# Patient Record
Sex: Male | Born: 1990 | Race: White | Hispanic: No | Marital: Married | Smoking: Never smoker
Health system: Southern US, Community
[De-identification: ages and names within clinical notes are randomized; demographics above are authoritative.]

## PROBLEM LIST (undated history)

## (undated) DIAGNOSIS — K219 Gastro-esophageal reflux disease without esophagitis: Secondary | ICD-10-CM

## (undated) HISTORY — DX: Gastro-esophageal reflux disease without esophagitis: K21.9

---
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
## 2008-06-20 HISTORY — PX: KNEE ARTHROSCOPY W/ MENISCECTOMY: SHX1879

## 2009-04-06 ENCOUNTER — Ambulatory Visit (HOSPITAL_BASED_OUTPATIENT_CLINIC_OR_DEPARTMENT_OTHER): Admission: RE | Admit: 2009-04-06 | Discharge: 2009-04-06 | Payer: Self-pay | Admitting: Orthopedic Surgery

## 2009-06-20 HISTORY — PX: OTHER SURGICAL HISTORY: SHX169

## 2010-07-24 ENCOUNTER — Inpatient Hospital Stay (INDEPENDENT_AMBULATORY_CARE_PROVIDER_SITE_OTHER)
Admission: RE | Admit: 2010-07-24 | Discharge: 2010-07-24 | Disposition: A | Discharge status: Home / Self Care | Payer: No Typology Code available for payment source | Source: Ambulatory Visit

## 2010-07-24 ENCOUNTER — Ambulatory Visit (HOSPITAL_COMMUNITY)
Admission: RE | Admit: 2010-07-24 | Discharge: 2010-07-24 | Disposition: A | Discharge status: Home / Self Care | Payer: No Typology Code available for payment source | Source: Ambulatory Visit | Attending: Family Medicine | Admitting: Family Medicine

## 2010-07-24 DIAGNOSIS — M549 Dorsalgia, unspecified: Secondary | ICD-10-CM | POA: Insufficient documentation

## 2010-07-24 DIAGNOSIS — M542 Cervicalgia: Secondary | ICD-10-CM

## 2016-06-22 ENCOUNTER — Encounter (HOSPITAL_COMMUNITY): Payer: Self-pay | Admitting: *Deleted

## 2016-06-22 ENCOUNTER — Emergency Department (HOSPITAL_COMMUNITY): Payer: BLUE CROSS/BLUE SHIELD

## 2016-06-22 ENCOUNTER — Emergency Department (HOSPITAL_COMMUNITY)
Admission: EM | Admit: 2016-06-22 | Discharge: 2016-06-22 | Disposition: A | Discharge status: Home / Self Care | Payer: BLUE CROSS/BLUE SHIELD | Attending: Emergency Medicine | Admitting: Emergency Medicine

## 2016-06-22 DIAGNOSIS — R109 Unspecified abdominal pain: Secondary | ICD-10-CM | POA: Insufficient documentation

## 2016-06-22 DIAGNOSIS — M545 Low back pain: Secondary | ICD-10-CM | POA: Diagnosis present

## 2016-06-22 DIAGNOSIS — N201 Calculus of ureter: Secondary | ICD-10-CM

## 2016-06-22 LAB — CBC WITH DIFFERENTIAL/PLATELET
BASOS ABS: 0 10*3/uL (ref 0.0–0.1)
BASOS PCT: 0 %
EOS ABS: 0 10*3/uL (ref 0.0–0.7)
EOS PCT: 0 %
HEMATOCRIT: 38.2 % — AB (ref 39.0–52.0)
Hemoglobin: 13.2 g/dL (ref 13.0–17.0)
Lymphocytes Relative: 10 %
Lymphs Abs: 1.1 10*3/uL (ref 0.7–4.0)
MCH: 28.9 pg (ref 26.0–34.0)
MCHC: 34.6 g/dL (ref 30.0–36.0)
MCV: 83.6 fL (ref 78.0–100.0)
MONO ABS: 0.7 10*3/uL (ref 0.1–1.0)
MONOS PCT: 6 %
Neutro Abs: 9.5 10*3/uL — ABNORMAL HIGH (ref 1.7–7.7)
Neutrophils Relative %: 84 %
PLATELETS: 207 10*3/uL (ref 150–400)
RBC: 4.57 MIL/uL (ref 4.22–5.81)
RDW: 12.5 % (ref 11.5–15.5)
WBC: 11.4 10*3/uL — ABNORMAL HIGH (ref 4.0–10.5)

## 2016-06-22 LAB — URINALYSIS, ROUTINE W REFLEX MICROSCOPIC
BACTERIA UA: NONE SEEN
BILIRUBIN URINE: NEGATIVE
GLUCOSE, UA: NEGATIVE mg/dL
KETONES UR: 5 mg/dL — AB
Leukocytes, UA: NEGATIVE
NITRITE: NEGATIVE
PH: 5 (ref 5.0–8.0)
PROTEIN: NEGATIVE mg/dL
Specific Gravity, Urine: 1.014 (ref 1.005–1.030)
Squamous Epithelial / LPF: NONE SEEN

## 2016-06-22 LAB — BASIC METABOLIC PANEL
Anion gap: 8 (ref 5–15)
BUN: 13 mg/dL (ref 6–20)
CALCIUM: 9.4 mg/dL (ref 8.9–10.3)
CO2: 26 mmol/L (ref 22–32)
CREATININE: 0.93 mg/dL (ref 0.61–1.24)
Chloride: 106 mmol/L (ref 101–111)
Glucose, Bld: 140 mg/dL — ABNORMAL HIGH (ref 65–99)
Potassium: 3.9 mmol/L (ref 3.5–5.1)
SODIUM: 140 mmol/L (ref 135–145)

## 2016-06-22 MED ORDER — HYDROMORPHONE HCL 1 MG/ML IJ SOLN
1.0000 mg | Freq: Once | INTRAMUSCULAR | Status: AC
  Administered 2016-06-22: 1 mg via INTRAVENOUS
  Filled 2016-06-22: qty 1

## 2016-06-22 MED ORDER — SODIUM CHLORIDE 0.9 % IV BOLUS (SEPSIS)
1000.0000 mL | Freq: Once | INTRAVENOUS | Status: AC
  Administered 2016-06-22: 1000 mL via INTRAVENOUS

## 2016-06-22 MED ORDER — ONDANSETRON HCL 4 MG/2ML IJ SOLN
4.0000 mg | Freq: Once | INTRAMUSCULAR | Status: AC
  Administered 2016-06-22: 4 mg via INTRAVENOUS
  Filled 2016-06-22: qty 2

## 2016-06-22 MED ORDER — HYDROCODONE-ACETAMINOPHEN 5-325 MG PO TABS: 1.0000 | ORAL_TABLET | ORAL | 0 refills | Status: DC | PRN

## 2016-06-22 MED ORDER — KETOROLAC TROMETHAMINE 30 MG/ML IJ SOLN
30.0000 mg | Freq: Once | INTRAMUSCULAR | Status: AC
  Administered 2016-06-22: 30 mg via INTRAVENOUS
  Filled 2016-06-22: qty 1

## 2016-06-22 NOTE — ED Triage Notes (Signed)
Patient is alert and oriented x4.  He is complaining of lower back pain that started 20 minutes prior to arrival.  Currently he rates his pain 10 of 10.  No trauma noted prior to pain.

## 2016-06-22 NOTE — ED Provider Notes (Signed)
[facility]-EMERGENCY DEPT Provider Note   CSN: [id number] Arrival date & time: 06/22/16  0103    By signing my name below, I, Steven Webster, attest that this documentation has been prepared under the direction and in the presence of Pricilla LovelessScott Dvid Pendry, MD. Electronically Signed: Valentino SaxonBianca Webster, ED Scribe. 06/22/16. 3:20 AM.  History   Chief Complaint Chief Complaint  Patient presents with  . Back Pain   The history is provided by the patient and the spouse. No language interpreter was used.   HPI Comments: Steven HusbandsLuke J Webster is a 26 y.o. male with PMHx of kidney stone, who presents to the Emergency Department complaining of sudden onset, lower left back pain onset three hours ago. Pt denies recent trauma, injury, heavy lifting, falls, twisting, bending. Pt is ambulatory with minimal difficulty. Pt notes he was sleeping when he was suddenly awakened by his back pain. He reports associated nausea and episodic emesis. He notes having a couple of episodes of vomiting PTA and in the ED waiting room. Pt denies radiation of back pain. No alleviating factors noted. Denies bowel or bladder incontinence, fever, cough, abdominal pain, dysuria, hematuria, numbness and weakness.  History reviewed. No pertinent past medical history.  There are no active problems to display for this patient.   History reviewed. No pertinent surgical history.     Home Medications    Prior to Admission medications   Medication Sig Start Date End Date Taking? Authorizing Provider  HYDROcodone-acetaminophen (NORCO) 5-325 MG tablet Take 1-2 tablets by mouth every 4 (four) hours as needed for severe pain. 06/22/16   Pricilla LovelessScott Nocole Zammit, MD    Family History No family history on file.  Social History Social History  Substance Use Topics  . Smoking status: Never Smoker  . Smokeless tobacco: Never Used  . Alcohol use Yes     Allergies   Patient has no known allergies.   Review of Systems Review of Systems    Constitutional: Negative for fever.  Respiratory: Negative for cough.   Gastrointestinal: Positive for nausea and vomiting. Negative for abdominal pain.  Genitourinary: Negative for dysuria and hematuria.  Musculoskeletal: Positive for back pain.  Neurological: Negative for weakness and numbness.  All other systems reviewed and are negative.    Physical Exam Updated Vital Signs BP 116/69 (BP Location: Left Arm)   Pulse 67   Temp 97.9 F (36.6 C) (Oral)   Resp 15   Ht 5\' 9"  (1.753 m)   Wt 165 lb (74.8 kg)   SpO2 99%   BMI 24.37 kg/m   Physical Exam  Constitutional: He is oriented to person, place, and time. He appears well-developed and well-nourished.  Laying on his side, appears in significant pain  HENT:  Head: Normocephalic and atraumatic.  Right Ear: External ear normal.  Left Ear: External ear normal.  Nose: Nose normal.  Eyes: Right eye exhibits no discharge. Left eye exhibits no discharge.  Neck: Neck supple.  Cardiovascular: Normal rate, regular rhythm and normal heart sounds.   Pulmonary/Chest: Effort normal and breath sounds normal.  Abdominal: Soft. There is no tenderness.  Musculoskeletal: He exhibits tenderness. He exhibits no edema.  Left CVA tenderness.   Neurological: He is alert and oriented to person, place, and time.  Skin: Skin is warm and dry.  Nursing note and vitals reviewed.    ED Treatments / Results   DIAGNOSTIC STUDIES: Oxygen Saturation is 97% on RA, normal by my interpretation.    COORDINATION OF CARE: 3:20 AM Discussed treatment plan  with pt at bedside which includes labs, nausea medication, pain medication and nonsteroidal anti-Inflammatory drug and pt agreed to plan.   Labs (all labs ordered are listed, but only abnormal results are displayed) Labs Reviewed  URINALYSIS, ROUTINE W REFLEX MICROSCOPIC - Abnormal; Notable for the following:       Result Value   Hgb urine dipstick SMALL (*)    Ketones, ur 5 (*)    All other  components within normal limits  CBC WITH DIFFERENTIAL/PLATELET - Abnormal; Notable for the following:    WBC 11.4 (*)    HCT 38.2 (*)    Neutro Abs 9.5 (*)    All other components within normal limits  BASIC METABOLIC PANEL - Abnormal; Notable for the following:    Glucose, Bld 140 (*)    All other components within normal limits    EKG  EKG Interpretation None       Radiology Ct Renal Stone Study  Result Date: 06/22/2016 CLINICAL DATA:  Left flank pain and microhematuria. Pain began this morning. EXAM: CT ABDOMEN AND PELVIS WITHOUT CONTRAST TECHNIQUE: Multidetector CT imaging of the abdomen and pelvis was performed following the standard protocol without IV contrast. COMPARISON:  06/18/2006 FINDINGS: Lower chest: No acute abnormality. Hepatobiliary: No focal liver abnormality is seen. No gallstones, gallbladder wall thickening, or biliary dilatation. Pancreas: Unremarkable. No pancreatic ductal dilatation or surrounding inflammatory changes. Spleen: Normal in size without focal abnormality. Adrenals/Urinary Tract: Both adrenals are normal. There is hydronephrosis and hydroureter on the left, all the way to the ureterovesical junction. There is moderate periureteral and perinephric stranding indicating an acute process. No ureteral calculus is evident, but there is a 3 x 5 mm calculus in the urinary bladder lumen. This probably is a recently passed stone. Right kidney and right ureter are normal. No additional urinary tract calculi are evident. No additional acute findings are evident. Stomach/Bowel: Stomach is within normal limits. Colon appears normal. No evidence of bowel wall thickening, distention, or inflammatory changes. Vascular/Lymphatic: No significant vascular findings are present. No enlarged abdominal or pelvic lymph nodes. Reproductive: Unremarkable Other: No ascites. Musculoskeletal: No significant skeletal lesions. IMPRESSION: 3 x 5 mm calculus within the urinary bladder lumen.  Probable recent stone passage on the left, with moderate hydroureteronephrosis and periureteral stranding. Electronically Signed   By: Ellery Plunk M.D.   On: 06/22/2016 04:47    Procedures Procedures (including critical care time)  Medications Ordered in ED Medications  sodium chloride 0.9 % bolus 1,000 mL (0 mLs Intravenous Stopped 06/22/16 0427)  HYDROmorphone (DILAUDID) injection 1 mg (1 mg Intravenous Given 06/22/16 0326)  ketorolac (TORADOL) 30 MG/ML injection 30 mg (30 mg Intravenous Given 06/22/16 0326)  ondansetron (ZOFRAN) injection 4 mg (4 mg Intravenous Given 06/22/16 0326)     Initial Impression / Assessment and Plan / ED Course  I have reviewed the triage vital signs and the nursing notes.  Pertinent labs & imaging results that were available during my care of the patient were reviewed by me and considered in my medical decision making (see chart for details).  Clinical Course as of Jun 23 715  Wed Jun 22, 2016  4098 Likely a ureteral stone. IV fluids, dilaudid, toradol, zofran and CT  [SG]  0507 Patient is feeling better. CT shows stone now in the bladder. Likely it was at the [facility] when I saw him and he passed it after pain medicine.  [SG]    Clinical Course User Index [SG] Pricilla Loveless, MD  Patient's symptoms c/w ureteral stone. Now in bladder. NSAIDs, short course of pain for residual spasm and passing through urethra. No infectious symptoms. Appears to be second episode overall, will refer to urology  Final Clinical Impressions(s) / ED Diagnoses   Final diagnoses:  Left ureteral stone    New Prescriptions Discharge Medication List as of 06/22/2016  5:07 AM    START taking these medications   Details  HYDROcodone-acetaminophen (NORCO) 5-325 MG tablet Take 1-2 tablets by mouth every 4 (four) hours as needed for severe pain., Starting Wed 06/22/2016, Print        I personally performed the services described in this documentation, which was scribed in my  presence. The recorded information has been reviewed and is accurate.     Pricilla Loveless, MD 06/22/16 [phone]

## 2017-01-12 ENCOUNTER — Encounter (HOSPITAL_COMMUNITY): Payer: Self-pay | Admitting: Emergency Medicine

## 2017-01-12 ENCOUNTER — Emergency Department (HOSPITAL_COMMUNITY): Payer: BLUE CROSS/BLUE SHIELD

## 2017-01-12 DIAGNOSIS — R0789 Other chest pain: Secondary | ICD-10-CM | POA: Diagnosis not present

## 2017-01-12 DIAGNOSIS — R1033 Periumbilical pain: Secondary | ICD-10-CM | POA: Diagnosis not present

## 2017-01-12 DIAGNOSIS — A084 Viral intestinal infection, unspecified: Secondary | ICD-10-CM | POA: Insufficient documentation

## 2017-01-12 LAB — BASIC METABOLIC PANEL
ANION GAP: 10 (ref 5–15)
BUN: 12 mg/dL (ref 6–20)
CALCIUM: 9.3 mg/dL (ref 8.9–10.3)
CO2: 24 mmol/L (ref 22–32)
Chloride: 103 mmol/L (ref 101–111)
Creatinine, Ser: 1.06 mg/dL (ref 0.61–1.24)
Glucose, Bld: 107 mg/dL — ABNORMAL HIGH (ref 65–99)
Potassium: 4 mmol/L (ref 3.5–5.1)
Sodium: 137 mmol/L (ref 135–145)

## 2017-01-12 LAB — CBC
HCT: 41.8 % (ref 39.0–52.0)
HEMOGLOBIN: 14.7 g/dL (ref 13.0–17.0)
MCH: 29.3 pg (ref 26.0–34.0)
MCHC: 35.2 g/dL (ref 30.0–36.0)
MCV: 83.4 fL (ref 78.0–100.0)
Platelets: 198 10*3/uL (ref 150–400)
RBC: 5.01 MIL/uL (ref 4.22–5.81)
RDW: 12.5 % (ref 11.5–15.5)
WBC: 7.4 10*3/uL (ref 4.0–10.5)

## 2017-01-12 LAB — I-STAT TROPONIN, ED: TROPONIN I, POC: 0 ng/mL (ref 0.00–0.08)

## 2017-01-12 NOTE — ED Triage Notes (Signed)
Pt presents with midsternal CP that radiates to L jaw line x couple days worse last night; pt denies hx of same

## 2017-01-13 ENCOUNTER — Emergency Department (HOSPITAL_COMMUNITY): Payer: BLUE CROSS/BLUE SHIELD

## 2017-01-13 ENCOUNTER — Emergency Department (HOSPITAL_COMMUNITY)
Admission: EM | Admit: 2017-01-13 | Discharge: 2017-01-13 | Disposition: A | Discharge status: Home / Self Care | Payer: BLUE CROSS/BLUE SHIELD | Attending: Emergency Medicine | Admitting: Emergency Medicine

## 2017-01-13 ENCOUNTER — Encounter (HOSPITAL_COMMUNITY): Payer: Self-pay

## 2017-01-13 DIAGNOSIS — A084 Viral intestinal infection, unspecified: Secondary | ICD-10-CM

## 2017-01-13 DIAGNOSIS — R110 Nausea: Secondary | ICD-10-CM

## 2017-01-13 DIAGNOSIS — R1033 Periumbilical pain: Secondary | ICD-10-CM

## 2017-01-13 DIAGNOSIS — R509 Fever, unspecified: Secondary | ICD-10-CM

## 2017-01-13 DIAGNOSIS — R0789 Other chest pain: Secondary | ICD-10-CM

## 2017-01-13 LAB — URINALYSIS, ROUTINE W REFLEX MICROSCOPIC
Bilirubin Urine: NEGATIVE
GLUCOSE, UA: NEGATIVE mg/dL
Hgb urine dipstick: NEGATIVE
Ketones, ur: 20 mg/dL — AB
LEUKOCYTES UA: NEGATIVE
Nitrite: NEGATIVE
PROTEIN: NEGATIVE mg/dL
Specific Gravity, Urine: 1.01 (ref 1.005–1.030)
pH: 5 (ref 5.0–8.0)

## 2017-01-13 LAB — HEPATIC FUNCTION PANEL
ALK PHOS: 52 U/L (ref 38–126)
ALT: 16 U/L — AB (ref 17–63)
AST: 26 U/L (ref 15–41)
Albumin: 4.5 g/dL (ref 3.5–5.0)
BILIRUBIN INDIRECT: 0.7 mg/dL (ref 0.3–0.9)
Bilirubin, Direct: 0.2 mg/dL (ref 0.1–0.5)
TOTAL PROTEIN: 7.3 g/dL (ref 6.5–8.1)
Total Bilirubin: 0.9 mg/dL (ref 0.3–1.2)

## 2017-01-13 LAB — I-STAT CG4 LACTIC ACID, ED: LACTIC ACID, VENOUS: 0.74 mmol/L (ref 0.5–1.9)

## 2017-01-13 LAB — I-STAT TROPONIN, ED: Troponin i, poc: 0.01 ng/mL (ref 0.00–0.08)

## 2017-01-13 LAB — LIPASE, BLOOD: LIPASE: 54 U/L — AB (ref 11–51)

## 2017-01-13 MED ORDER — ONDANSETRON HCL 4 MG/2ML IJ SOLN
4.0000 mg | Freq: Once | INTRAMUSCULAR | Status: AC
  Administered 2017-01-13: 4 mg via INTRAVENOUS
  Filled 2017-01-13: qty 2

## 2017-01-13 MED ORDER — SODIUM CHLORIDE 0.9 % IV BOLUS (SEPSIS)
1000.0000 mL | Freq: Once | INTRAVENOUS | Status: AC
  Administered 2017-01-13: 1000 mL via INTRAVENOUS

## 2017-01-13 MED ORDER — ONDANSETRON 4 MG PO TBDP: 4.0000 mg | ORAL_TABLET | Freq: Three times a day (TID) | ORAL | 0 refills | Status: DC | PRN

## 2017-01-13 MED ORDER — IOPAMIDOL (ISOVUE-300) INJECTION 61%
INTRAVENOUS | Status: AC
  Administered 2017-01-13: 80 mL
  Filled 2017-01-13: qty 100

## 2017-01-13 MED ORDER — GI COCKTAIL ~~LOC~~
30.0000 mL | Freq: Once | ORAL | Status: AC
  Administered 2017-01-13: 30 mL via ORAL
  Filled 2017-01-13: qty 30

## 2017-01-13 MED ORDER — FAMOTIDINE IN NACL 20-0.9 MG/50ML-% IV SOLN
20.0000 mg | Freq: Once | INTRAVENOUS | Status: AC
  Administered 2017-01-13: 20 mg via INTRAVENOUS
  Filled 2017-01-13: qty 50

## 2017-01-13 MED ORDER — MORPHINE SULFATE (PF) 4 MG/ML IV SOLN
4.0000 mg | Freq: Once | INTRAVENOUS | Status: AC
  Administered 2017-01-13: 4 mg via INTRAVENOUS
  Filled 2017-01-13: qty 1

## 2017-01-13 MED ORDER — RANITIDINE HCL 150 MG PO TABS: 150.0000 mg | ORAL_TABLET | Freq: Two times a day (BID) | ORAL | 0 refills | Status: DC

## 2017-01-13 NOTE — ED Notes (Signed)
ED Provider at bedside. 

## 2017-01-13 NOTE — ED Notes (Signed)
Patient transported to CT 

## 2017-01-13 NOTE — ED Provider Notes (Signed)
[facility]-EMERGENCY DEPT Provider Note   CSN: [id number] Arrival date & time: 01/12/17  2037     History   Chief Complaint Chief Complaint  Patient presents with  . Chest Pain    HPI Steven Webster is a 26 y.o. male with a PMHx of kidney stones, who presents to the ED with complaints of periumbilical abdominal pain that began 4 days ago but worsened last night. Patient describes the pain as 6/10 intermittent sharp periumbilical pain that radiates to the middle of his back, with no known aggravating factors, and unrelieved with Zantac, Philip's stool softeners, and Advil. Associated symptoms include nausea, constipation with no BM in 3 days, and today he developed fever of 100.4 as well as chills and sweats. Last night he also developed dull central chest pain that is mild and radiated to his left jaw however this is not what brought him in today and is not as severe as his abdominal pain is. He has a history of kidney stones however he does not feel like today symptoms feel like that. He does not currently have a PCP since he just moved here one year ago. He has not eaten anything since 8 AM. He denies any recent travel, sick contacts, suspicious food intake, NSAID use, or prior abdominal surgeries. He is a nonsmoker. +FHx of MI in [place] in his 4740s. +EtOH use, 1 liquor drink ~3-4x/wk.   He denies URI symptoms, cough, SOB, wheezing, lightheadedness, diaphoresis, claudication, orthopnea, LE swelling, recent travel/surgery/immobilization, personal/family hx of DVT/PE, vomiting, diarrhea, obstipation, melena or hematochezia with his last BM, hematuria, dysuria, testicular pain/swelling, penile discharge, rectal pain, myalgias, arthralgias, numbness, tingling, focal weakness, or any other complaints at this time.    The history is provided by the patient and medical records. No language interpreter was used.  Abdominal Pain   This is a new problem. The current episode started more than 2 days ago. Episode  frequency: intermittent. The problem has been gradually worsening. The pain is associated with an unknown factor. The pain is located in the periumbilical region. The quality of the pain is sharp. The pain is at a severity of 6/10. The pain is moderate. Associated symptoms include fever, nausea and constipation. Pertinent negatives include diarrhea, flatus, hematochezia, melena, vomiting, dysuria, hematuria, arthralgias and myalgias. Nothing aggravates the symptoms. Nothing relieves the symptoms.    History reviewed. No pertinent past medical history.  There are no active problems to display for this patient.   History reviewed. No pertinent surgical history.     Home Medications    Prior to Admission medications   Medication Sig Start Date End Date Taking? Authorizing Provider  HYDROcodone-acetaminophen (NORCO) 5-325 MG tablet Take 1-2 tablets by mouth every 4 (four) hours as needed for severe pain. 06/22/16   Pricilla LovelessGoldston, Scott, MD    Family History History reviewed. No pertinent family history.  Social History Social History  Substance Use Topics  . Smoking status: Never Smoker  . Smokeless tobacco: Never Used  . Alcohol use Yes     Allergies   Patient has no known allergies.   Review of Systems Review of Systems  Constitutional: Positive for chills and fever. Negative for diaphoresis.  HENT: Negative for rhinorrhea and sore throat.   Respiratory: Negative for cough, shortness of breath and wheezing.   Cardiovascular: Positive for chest pain (dull central). Negative for leg swelling.  Gastrointestinal: Positive for abdominal pain, constipation and nausea. Negative for blood in stool, diarrhea, flatus, hematochezia, melena, rectal pain  and vomiting.  Genitourinary: Negative for discharge, dysuria, hematuria, scrotal swelling and testicular pain.  Musculoskeletal: Negative for arthralgias and myalgias.  Skin: Negative for color change.  Allergic/Immunologic: Negative for  immunocompromised state.  Neurological: Negative for weakness, light-headedness and numbness.  Psychiatric/Behavioral: Negative for confusion.   All other systems reviewed and are negative for acute change except as noted in the HPI.    Physical Exam Updated Vital Signs BP 132/86   Pulse 92   Temp 99.6 F (37.6 C) (Oral)   Resp 18   Ht 5\' 10"  (1.778 m)   Wt 74.8 kg (165 lb)   SpO2 99%   BMI 23.68 kg/m   Physical Exam  Constitutional: He is oriented to person, place, and time. Vital signs are normal. He appears well-developed and well-nourished.  Non-toxic appearance. No distress.  Low-grade temp 99.6, nontoxic, NAD  HENT:  Head: Normocephalic and atraumatic.  Mouth/Throat: Oropharynx is clear and moist and mucous membranes are normal.  Eyes: Conjunctivae and EOM are normal. Right eye exhibits no discharge. Left eye exhibits no discharge.  Neck: Normal range of motion. Neck supple.  Cardiovascular: Normal rate, regular rhythm, normal heart sounds and intact distal pulses.  Exam reveals no gallop and no friction rub.   No murmur heard. RRR, nl s1/s2, no m/r/g, distal pulses intact, no pedal edema   Pulmonary/Chest: Effort normal and breath sounds normal. No respiratory distress. He has no decreased breath sounds. He has no wheezes. He has no rhonchi. He has no rales.  CTAB in all lung fields, no w/r/r, no hypoxia or increased WOB, speaking in full sentences, SpO2 96% on RA   Abdominal: Soft. Normal appearance and bowel sounds are normal. He exhibits no distension. There is tenderness in the right upper quadrant, epigastric area and periumbilical area. There is no rigidity, no rebound, no guarding, no CVA tenderness, no tenderness at McBurney's point and negative Murphy's sign.  Soft, nondistended, +BS throughout, with mild epigastric/RUQ/periumbilical TTP, no r/g/r, neg murphy's although elicits pain but still able to fully inspire; neg mcburney's point tenderness, no lower abd TTP, no  CVA TTP   Musculoskeletal: Normal range of motion.  MAE x4 Strength and sensation grossly intact in all extremities Distal pulses intact No pedal edema, neg homan's bilaterally   Neurological: He is alert and oriented to person, place, and time. He has normal strength. No sensory deficit.  Skin: Skin is warm, dry and intact. No rash noted.  Psychiatric: He has a normal mood and affect.  Nursing note and vitals reviewed.    ED Treatments / Results  Labs (all labs ordered are listed, but only abnormal results are displayed) Labs Reviewed  BASIC METABOLIC PANEL - Abnormal; Notable for the following:       Result Value   Glucose, Bld 107 (*)    All other components within normal limits  HEPATIC FUNCTION PANEL - Abnormal; Notable for the following:    ALT 16 (*)    All other components within normal limits  URINALYSIS, ROUTINE W REFLEX MICROSCOPIC - Abnormal; Notable for the following:    Ketones, ur 20 (*)    All other components within normal limits  LIPASE, BLOOD - Abnormal; Notable for the following:    Lipase 54 (*)    All other components within normal limits  CBC  I-STAT TROPONIN, ED  I-STAT TROPONIN, ED  I-STAT CG4 LACTIC ACID, ED    EKG  EKG Interpretation  Date/Time:  Thursday January 12 2017 20:50:45 EDT  Ventricular Rate:  91 PR Interval:  128 QRS Duration: 84 QT Interval:  326 QTC Calculation: 400 R Axis:   76 Text Interpretation:  Normal sinus rhythm Normal ECG No old tracing to compare Confirmed by Azalia Bilis ([id number]) on 01/13/2017 1:35:43 AM       Radiology Dg Chest 2 View  Result Date: 01/12/2017 CLINICAL DATA:  Central chest pain. EXAM: CHEST  2 VIEW COMPARISON:  None. FINDINGS: The heart size and mediastinal contours are within normal limits. Both lungs are clear. The visualized skeletal structures are unremarkable. IMPRESSION: No active cardiopulmonary disease. Electronically Signed   By: Sherian Rein M.D.   On: 01/12/2017 21:23   Ct Abdomen Pelvis  W Contrast  Result Date: 01/13/2017 CLINICAL DATA:  Periumbilical pain for a few days. Nausea and vomiting. Low-grade chills and subjective fever EXAM: CT ABDOMEN AND PELVIS WITH CONTRAST TECHNIQUE: Multidetector CT imaging of the abdomen and pelvis was performed using the standard protocol following bolus administration of intravenous contrast. CONTRAST:  80ml ISOVUE-300 IOPAMIDOL (ISOVUE-300) INJECTION 61% COMPARISON:  06/22/2016 FINDINGS: Lower chest: No acute abnormality. Hepatobiliary: No focal liver abnormality is seen. No gallstones, gallbladder wall thickening, or biliary dilatation. Pancreas: Unremarkable. No pancreatic ductal dilatation or surrounding inflammatory changes. Spleen: Normal in size without focal abnormality. Adrenals/Urinary Tract: Adrenal glands are unremarkable. Kidneys are normal, without renal calculi, focal lesion, or hydronephrosis. Bladder is unremarkable. Stomach/Bowel: Stomach is normal. Proximal small bowel is normal. Slight fluid distention of mid to distal small bowel with mild mural enhancement. No caliber transition to decompressed distal small bowel. Generous volume stool throughout colon. No inflammation or obstruction of colon. No focal inflammation of bowel. No extraluminal gas. Vascular/Lymphatic: No significant vascular findings are present. No enlarged abdominal or pelvic lymph nodes. Reproductive: Unremarkable Other: No ascites. Musculoskeletal: No significant skeletal lesion. IMPRESSION: Generous volume fluid throughout slightly distended mid to distal small bowel with mild mural enhancement. This is nonobstructive and may represent an enteritis. Electronically Signed   By: Ellery Plunk M.D.   On: 01/13/2017 02:40    Procedures Procedures (including critical care time)  Medications Ordered in ED Medications  ondansetron (ZOFRAN) injection 4 mg (4 mg Intravenous Given 01/13/17 0204)  sodium chloride 0.9 % bolus 1,000 mL (0 mLs Intravenous Stopped 01/13/17  0356)  morphine 4 MG/ML injection 4 mg (4 mg Intravenous Given 01/13/17 0204)  iopamidol (ISOVUE-300) 61 % injection (80 mLs  Contrast Given 01/13/17 0218)  gi cocktail (Maalox,Lidocaine,Donnatal) (30 mLs Oral Given 01/13/17 0304)  famotidine (PEPCID) IVPB 20 mg premix (0 mg Intravenous Stopped 01/13/17 0311)     Initial Impression / Assessment and Plan / ED Course  I have reviewed the triage vital signs and the nursing notes.  Pertinent labs & imaging results that were available during my care of the patient were reviewed by me and considered in my medical decision making (see chart for details).     26 y.o. male here with c/o periumbilical/upper abd pain radiating to his back x4 days that worsened last night, and then developed mild dull central CP last night but states that's not what brought him in. Reports fever 100.4 at home, chills, and nausea, and constipation. On exam, mild RUQ/epigastric TTP, no lower abd TTP, nonperitoneal, neg murphy's although murphy's exam elicits pain but he's able to fully inspire. Work up thus far: EKG unremarkable, trop neg x2, BMP WNL, CBC WNL, CXR neg. Unfortunately I think triage nursing staff misunderstood what his complaint was, so we'll need to add  on a few other items in order to further work up his abd pain complaint, which is really what he's here for. Doubt appendicitis, although fever is slightly worrisome for that, but lack of leukocytosis is reassuring. Will add on lactic, U/A, LFTs, lipase, and get CT abd/pelv (considered getting abd xray and abd U/S, but CT would give [place] a more complete view of everything, and still eval for all the same things we'd be looking for with the xray/US combo). Will give morphine, zofran, fluids, pepcid, GI cocktail, and reassess afterwards.   5:17 AM Lactic neg. U/A with a few ketones but otherwise negative. LFTs unremarkable. Lipase 54 which is just barely over the cusp of normal, could reflect downtrending level, but doubt  acute pancreatitis at this point. CT abd/pelv showing generous volume through slightly distended mid-to-distal small bowel which is nonobstructive and could represent enteritis. He has no diarrhea, but he could just be backed up (has some solid stool scattered around the colon on CT images) and the diarrhea could be coming. Overall, could be viral illness/gastroenteritis with gastritis/GERD causing the CP he was reporting. With the remainder of the work up being reassuring, doubt need for further emergent work up at this pt. Pt feeling better and tolerating PO well. Will send home with zantac and zofran rx, advised BRAT diet if he starts having diarrhea, stay hydrated, tylenol/motrin for pain, diet/lifestyle modifications advised, and f/up with a PCP using insurance carrier's website to help find one, or using the resource number on d/c paperwork. Strict return precautions advised. Discussed case with my attending Dr. Patria Mane who agrees with plan.  I explained the diagnosis and have given explicit precautions to return to the ER including for any other new or worsening symptoms. The patient understands and accepts the medical plan as it's been dictated and I have answered their questions. Discharge instructions concerning home care and prescriptions have been given. The patient is STABLE and is discharged to home in good condition.    Final Clinical Impressions(s) / ED Diagnoses   Final diagnoses:  Periumbilical abdominal pain  Nausea  Viral gastroenteritis  Fever chills  Atypical chest pain    New Prescriptions New Prescriptions   ONDANSETRON (ZOFRAN ODT) 4 MG DISINTEGRATING TABLET    Take 1 tablet (4 mg total) by mouth every 8 (eight) hours as needed for nausea or vomiting.   RANITIDINE (ZANTAC) 150 MG TABLET    Take 1 tablet (150 mg total) by mouth 2 (two) times daily.     [address] [place], [place] 01/13/17 Jorja Loa, MD 01/14/17 [phone]

## 2017-01-13 NOTE — Discharge Instructions (Signed)
Your labs, EKG, and chest xray were reassuring today; your CT scan shows that you have some inflammation in the small intestines, which is likely from a viral illness; don't be surprised if you start having some diarrhea in the next couple days, as this sometimes occurs with viral illnesses. You could also have some stomach irritation/gastritis which could be contributing to why your chest was hurting. Take zantac as directed, and consider using over the counter tums/maalox for additional relief. Avoid spicy/fried/fatty/acidic foods, and avoid alcohol/soda/tea/coffee. Use zofran as prescribed, as needed for nausea. Alternate between tylenol and motrin as needed for pain. Stay well hydrated with small sips of fluids throughout the day. If you develop diarrhea, then follow a BRAT (banana-rice-applesauce-toast) diet as described below for 24-48 hours. The 'BRAT' diet is suggested, then progress to diet as tolerated as symptoms abate. Call if bloody stools, persistent diarrhea, vomiting, fever or abdominal pain. Establish follow up care with a regular doctor, either by using your insurance carrier's website to find one in this area or by calling the number below to help find a doctor nearby, and  schedule an appointment in 1 week for recheck of symptoms and ongoing primary medical care. Return to ER for changing or worsening of symptoms.

## 2017-12-20 IMAGING — CT CT ABD-PELV W/ CM
2 of 4 series · 16 of 46 positions shown, 18 images · IV contrast (iopamidol)
Comparison: 06/22/2016

CLINICAL DATA: Periumbilical pain for a few days. Nausea and
vomiting. Low-grade chills and subjective fever

EXAM:
CT ABDOMEN AND PELVIS WITH CONTRAST
TECHNIQUE: Multidetector CT imaging of the abdomen and pelvis was performed
using the standard protocol following bolus administration of
intravenous contrast.
CONTRAST:  80ml GC24GR-FAA IOPAMIDOL (GC24GR-FAA) INJECTION 61%

[Series 3: a/p w/ 5mm · axial · 0.74mm/px · z∈[+614,+1038]mm · 13 of 93 slices shown, 15 images]
[im 4/93  soft-tissue]
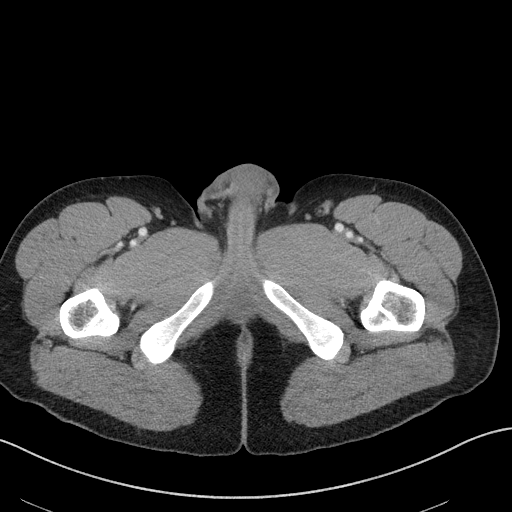
[im 4/93  bone]
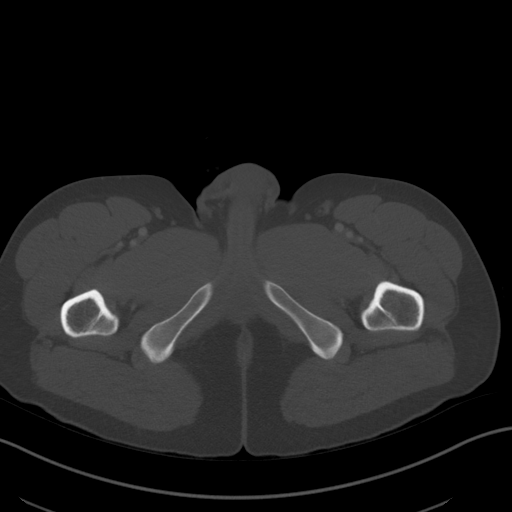
[im 12/93  soft-tissue]
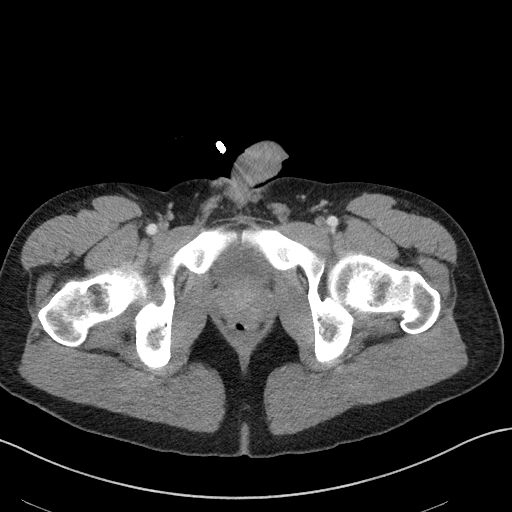
[im 19/93  soft-tissue]
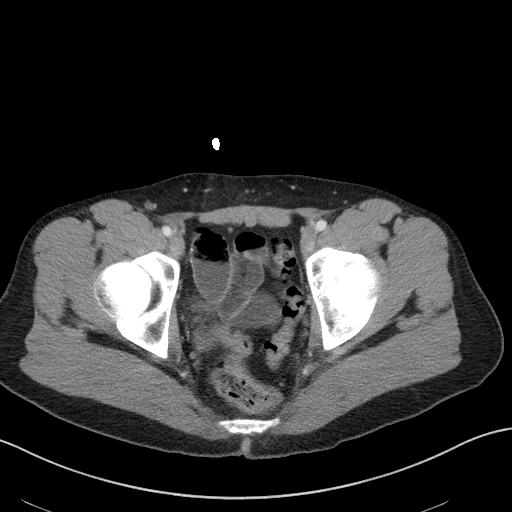
[im 26/93  soft-tissue]
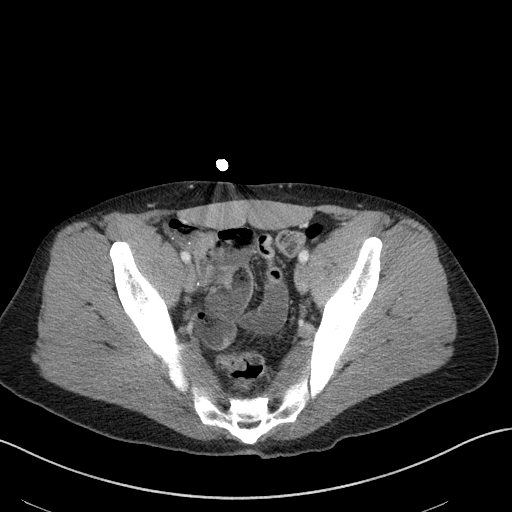
[im 34/93  soft-tissue]
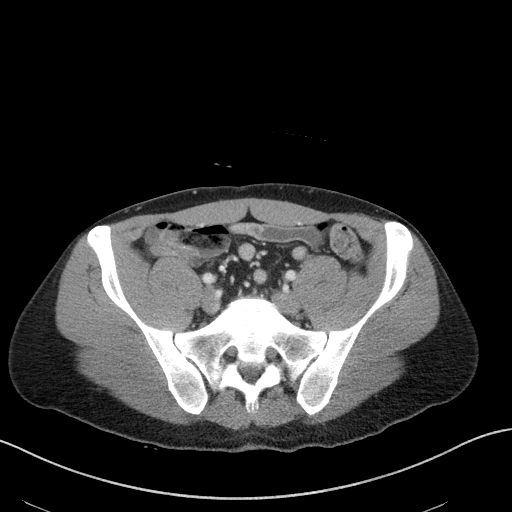
[im 41/93  soft-tissue]
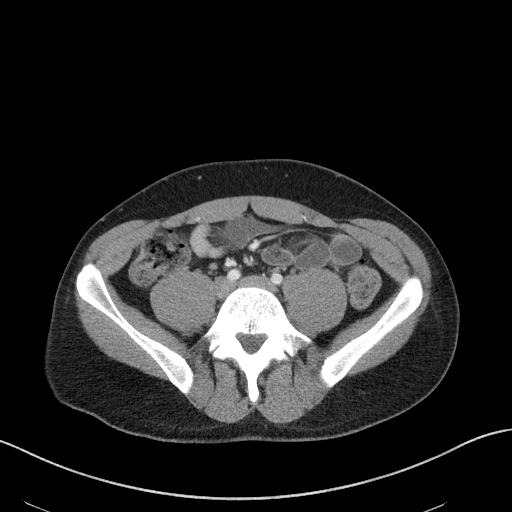
[im 48/93  soft-tissue]
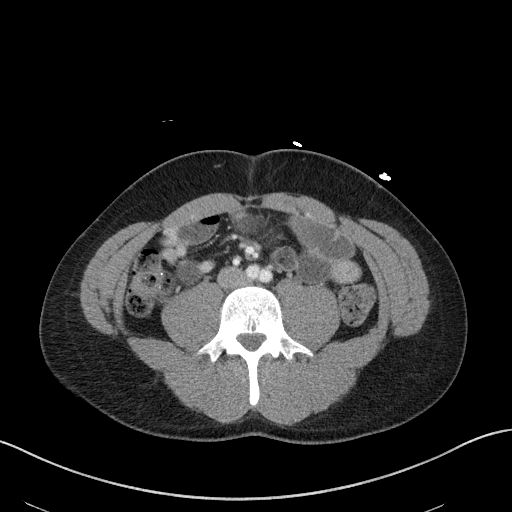
[im 52/93  soft-tissue]
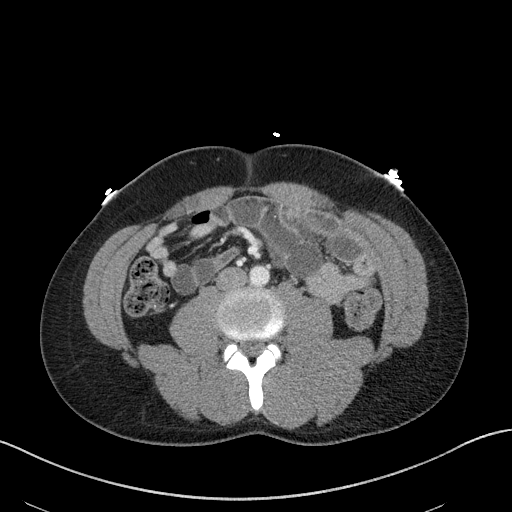
[im 59/93  soft-tissue]
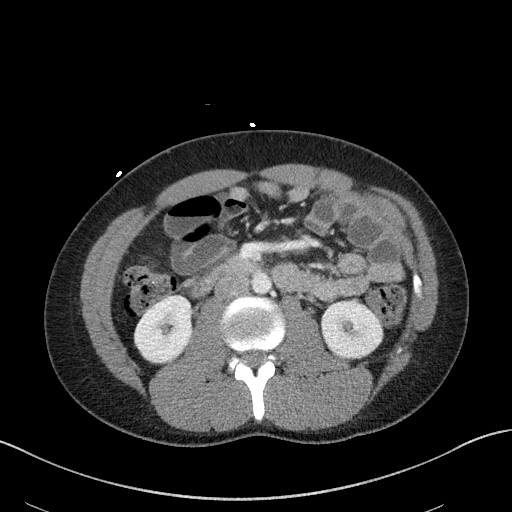
[im 59/93  bone]
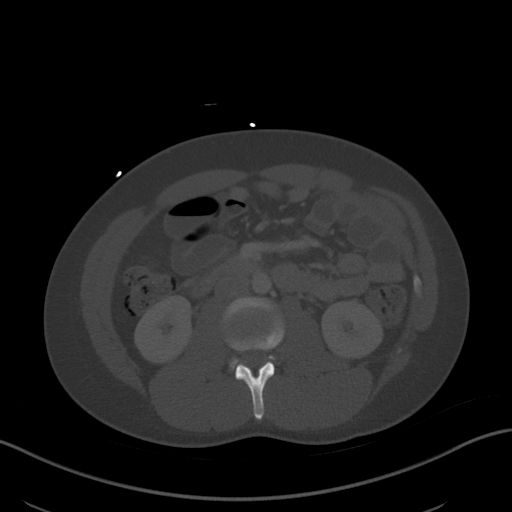
[im 67/93  soft-tissue]
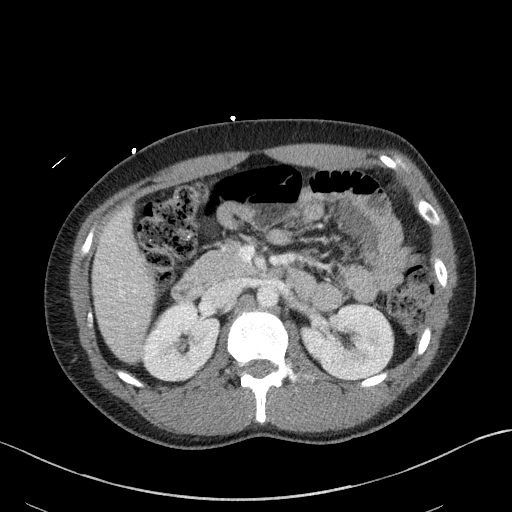
[im 74/93  soft-tissue]
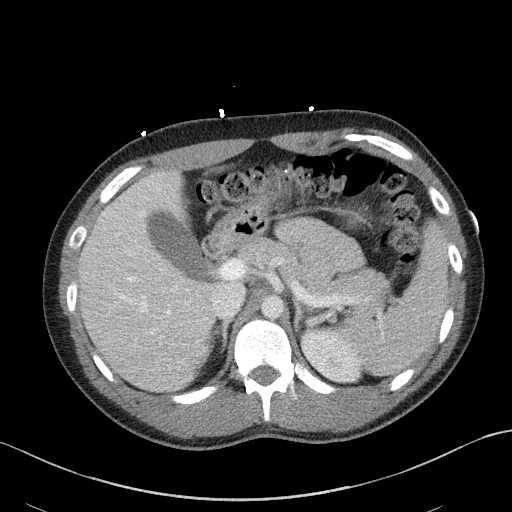
[im 81/93  soft-tissue]
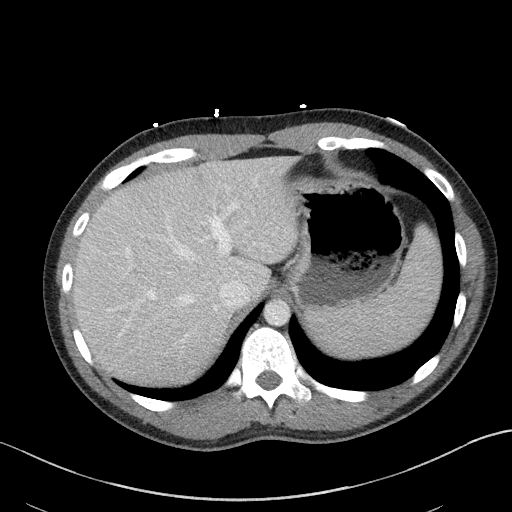
[im 89/93  soft-tissue]
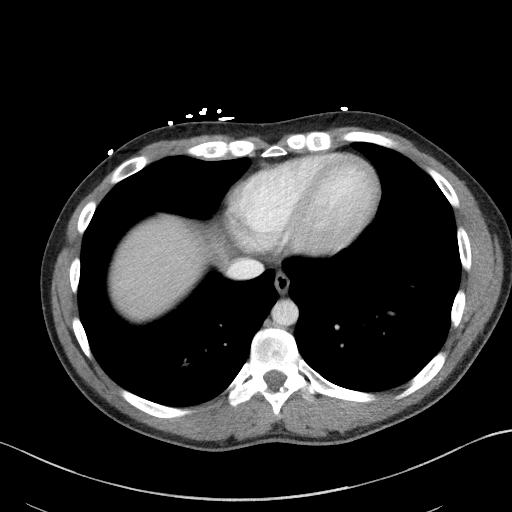

[Series 6: a/p w/ cor · coronal · 0.85mm/px · 3 of 123 slices shown]
[im 41/123  soft-tissue]
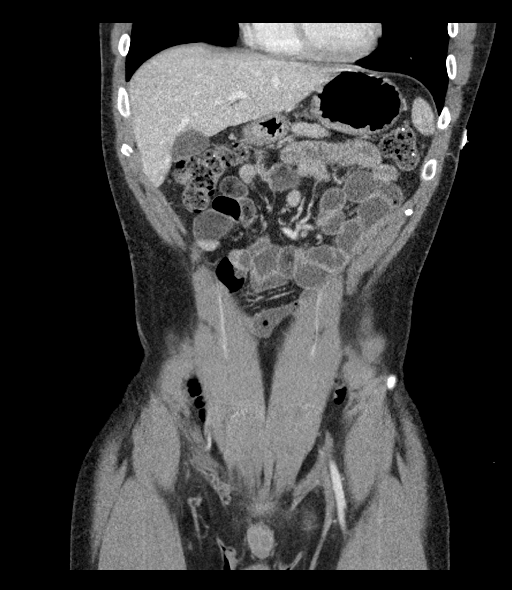
[im 55/123  soft-tissue]
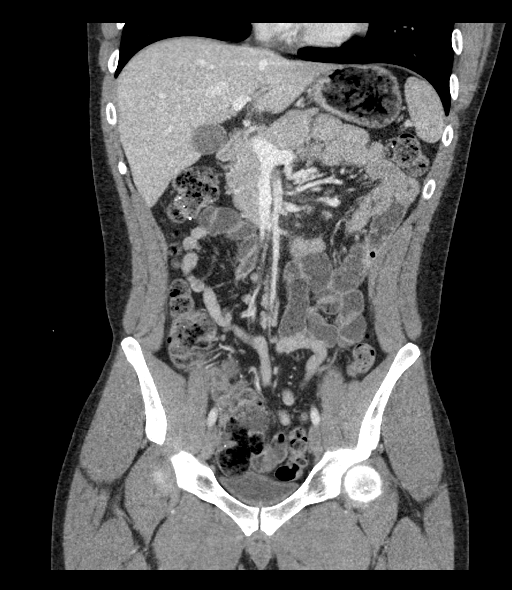
[im 68/123  soft-tissue]
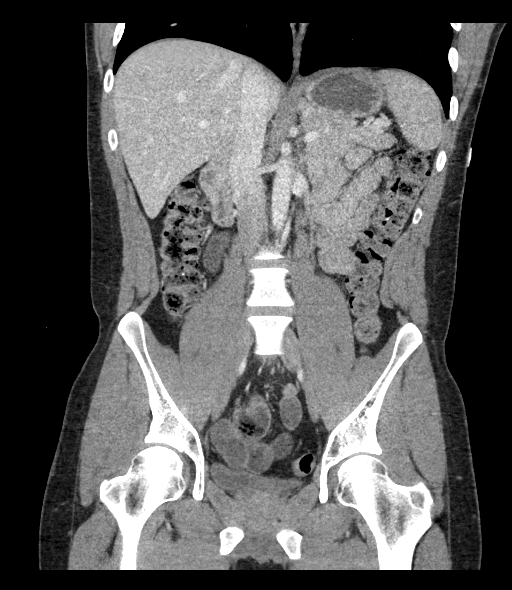

[16 of 46 positions shown; findings below may reference images not displayed]

FINDINGS: Lower chest: No acute abnormality.

Hepatobiliary: No focal liver abnormality is seen. No gallstones,
gallbladder wall thickening, or biliary dilatation.

Pancreas: Unremarkable. No pancreatic ductal dilatation or
surrounding inflammatory changes.

Spleen: Normal in size without focal abnormality.

Adrenals/Urinary Tract: Adrenal glands are unremarkable. Kidneys are
normal, without renal calculi, focal lesion, or hydronephrosis.
Bladder is unremarkable.

Stomach/Bowel: Stomach is normal. Proximal small bowel is normal.
Slight fluid distention of mid to distal small bowel with mild mural
enhancement. No caliber transition to decompressed distal small
bowel. Generous volume stool throughout colon. No inflammation or
obstruction of colon. No focal inflammation of bowel. No
extraluminal gas.

Vascular/Lymphatic: No significant vascular findings are present. No
enlarged abdominal or pelvic lymph nodes.

Reproductive: Unremarkable

Other: No ascites.

Musculoskeletal: No significant skeletal lesion.
IMPRESSION: Generous volume fluid throughout slightly distended mid to distal
small bowel with mild mural enhancement. This is nonobstructive and
may represent an enteritis.

## 2018-01-02 DIAGNOSIS — J019 Acute sinusitis, unspecified: Secondary | ICD-10-CM | POA: Diagnosis not present

## 2018-04-26 DIAGNOSIS — J019 Acute sinusitis, unspecified: Secondary | ICD-10-CM | POA: Diagnosis not present

## 2018-06-18 DIAGNOSIS — H10413 Chronic giant papillary conjunctivitis, bilateral: Secondary | ICD-10-CM | POA: Diagnosis not present

## 2019-04-08 ENCOUNTER — Encounter (HOSPITAL_COMMUNITY): Payer: Self-pay | Admitting: Emergency Medicine

## 2019-04-08 ENCOUNTER — Emergency Department (HOSPITAL_COMMUNITY): Payer: Self-pay

## 2019-04-08 ENCOUNTER — Emergency Department (HOSPITAL_COMMUNITY)
Admission: EM | Admit: 2019-04-08 | Discharge: 2019-04-08 | Disposition: A | Discharge status: Home / Self Care | Payer: Self-pay | Attending: Emergency Medicine | Admitting: Emergency Medicine

## 2019-04-08 DIAGNOSIS — N200 Calculus of kidney: Secondary | ICD-10-CM | POA: Insufficient documentation

## 2019-04-08 DIAGNOSIS — R109 Unspecified abdominal pain: Secondary | ICD-10-CM

## 2019-04-08 LAB — CBC WITH DIFFERENTIAL/PLATELET
Abs Immature Granulocytes: 0.02 10*3/uL (ref 0.00–0.07)
Basophils Absolute: 0 10*3/uL (ref 0.0–0.1)
Basophils Relative: 1 %
Eosinophils Absolute: 0.2 10*3/uL (ref 0.0–0.5)
Eosinophils Relative: 3 %
HCT: 41.8 % (ref 39.0–52.0)
Hemoglobin: 13.7 g/dL (ref 13.0–17.0)
Immature Granulocytes: 0 %
Lymphocytes Relative: 23 %
Lymphs Abs: 1.4 10*3/uL (ref 0.7–4.0)
MCH: 29.3 pg (ref 26.0–34.0)
MCHC: 32.8 g/dL (ref 30.0–36.0)
MCV: 89.3 fL (ref 80.0–100.0)
Monocytes Absolute: 0.5 10*3/uL (ref 0.1–1.0)
Monocytes Relative: 8 %
Neutro Abs: 4 10*3/uL (ref 1.7–7.7)
Neutrophils Relative %: 65 %
Platelets: 209 10*3/uL (ref 150–400)
RBC: 4.68 MIL/uL (ref 4.22–5.81)
RDW: 13.2 % (ref 11.5–15.5)
WBC: 6.1 10*3/uL (ref 4.0–10.5)
nRBC: 0 % (ref 0.0–0.2)

## 2019-04-08 LAB — BASIC METABOLIC PANEL
Anion gap: 6 (ref 5–15)
BUN: 8 mg/dL (ref 6–20)
CO2: 26 mmol/L (ref 22–32)
Calcium: 9.1 mg/dL (ref 8.9–10.3)
Chloride: 107 mmol/L (ref 98–111)
Creatinine, Ser: 0.94 mg/dL (ref 0.61–1.24)
GFR calc Af Amer: >60 mL/min (ref >60)
GFR calc non Af Amer: >60 mL/min (ref >60)
Glucose, Bld: 93 mg/dL (ref 70–99)
Potassium: 4.4 mmol/L (ref 3.5–5.1)
Sodium: 139 mmol/L (ref 135–145)

## 2019-04-08 LAB — URINALYSIS, ROUTINE W REFLEX MICROSCOPIC
Bilirubin Urine: NEGATIVE
Glucose, UA: NEGATIVE mg/dL
Hgb urine dipstick: NEGATIVE
Ketones, ur: NEGATIVE mg/dL
Leukocytes,Ua: NEGATIVE
Nitrite: NEGATIVE
Protein, ur: NEGATIVE mg/dL
Specific Gravity, Urine: 1.006 (ref 1.005–1.030)
pH: 6 (ref 5.0–8.0)

## 2019-04-08 MED ORDER — CYCLOBENZAPRINE HCL 10 MG PO TABS: 10.0000 mg | ORAL_TABLET | Freq: Two times a day (BID) | ORAL | 0 refills | Status: AC | PRN

## 2019-04-08 MED ORDER — KETOROLAC TROMETHAMINE 30 MG/ML IJ SOLN
30.0000 mg | Freq: Once | INTRAMUSCULAR | Status: AC
  Administered 2019-04-08: 11:00:00 30 mg via INTRAMUSCULAR
  Filled 2019-04-08: qty 1

## 2019-04-08 MED ORDER — OXYCODONE-ACETAMINOPHEN 5-325 MG PO TABS: 1.0000 | ORAL_TABLET | Freq: Four times a day (QID) | ORAL | 0 refills | Status: AC | PRN

## 2019-04-08 NOTE — ED Notes (Signed)
PT verbalized understanding of discharge paperwork, prescriptions and follow-up care. 

## 2019-04-08 NOTE — ED Triage Notes (Signed)
Patient c/o left sided flank pain onset of Friday. Hx of kidney stones. Still passing urine. Denies fevers.

## 2019-04-08 NOTE — ED Provider Notes (Signed)
[facility] EMERGENCY DEPARTMENT Provider Note   CSN: [id number] Arrival date & time: 04/08/19  8841     History   Chief Complaint Chief Complaint  Patient presents with  . Flank Pain    HPI Steven Webster is a 28 y.o. male.     HPI   28 year old male presents today with complaints of left-sided flank pain.  Patient notes symptoms started proximally 3 days ago with a dull ache in his left flank, this is become more sharp in nature, more painful.  He denies any abdominal pain nausea vomiting fever, denies any urinary symptoms.  He notes this feels similar to previous kidney stone which he had approximately 2 years ago.  He notes taking ibuprofen last night no medications today.   There are no active problems to display for this patient.   History reviewed. No pertinent surgical history.      Home Medications    Prior to Admission medications   Medication Sig Start Date End Date Taking? Authorizing Provider  acetaminophen (TYLENOL) 500 MG tablet Take 500 mg by mouth every 6 (six) hours as needed for mild pain.   Yes [provider]  cetirizine (ZYRTEC) 10 MG tablet Take 10 mg by mouth daily as needed for allergies.   Yes [provider]  ibuprofen (ADVIL) 200 MG tablet Take 800 mg by mouth every 6 (six) hours as needed for moderate pain.   Yes [provider]  cyclobenzaprine (FLEXERIL) 10 MG tablet Take 1 tablet (10 mg total) by mouth 2 (two) times daily as needed for muscle spasms. 04/08/19   Dorien Mayotte, Tinnie Gens, PA-C  oxyCODONE-acetaminophen (PERCOCET/ROXICET) 5-325 MG tablet Take 1 tablet by mouth every 6 (six) hours as needed. 04/08/19   Eyvonne Mechanic, PA-C    Family History No family history on file.  Social History Social History   Tobacco Use  . Smoking status: Never Smoker  . Smokeless tobacco: Never Used  Substance Use Topics  . Alcohol use: Yes  . Drug use: No     Allergies   Amoxicillin   Review of  Systems Review of Systems  All other systems reviewed and are negative.    Physical Exam Updated Vital Signs BP 129/75   Pulse 61   Temp 97.9 F (36.6 C) (Oral)   Resp 14   SpO2 100%   Physical Exam Vitals signs and nursing note reviewed.  Constitutional:      Appearance: He is well-developed.  HENT:     Head: Normocephalic and atraumatic.  Eyes:     General: No scleral icterus.       Right eye: No discharge.        Left eye: No discharge.     Conjunctiva/sclera: Conjunctivae normal.     Pupils: Pupils are equal, round, and reactive to light.  Neck:     Musculoskeletal: Normal range of motion.     Vascular: No JVD.     Trachea: No tracheal deviation.  Pulmonary:     Effort: Pulmonary effort is normal.     Breath sounds: No stridor.  Abdominal:     Comments: Minor pain with percussion of the left CVA-abdomen soft nontender to palpation  Neurological:     Mental Status: He is alert and oriented to person, place, and time.     Coordination: Coordination normal.  Psychiatric:        Behavior: Behavior normal.        Thought Content: Thought content normal.  Judgment: Judgment normal.      ED Treatments / Results  Labs (all labs ordered are listed, but only abnormal results are displayed) Labs Reviewed  URINALYSIS, ROUTINE W REFLEX MICROSCOPIC - Abnormal; Notable for the following components:      Result Value   Color, Urine STRAW (*)    All other components within normal limits  CBC WITH DIFFERENTIAL/PLATELET  BASIC METABOLIC PANEL    EKG None  Radiology Ct Renal Stone Study  Result Date: 04/08/2019 CLINICAL DATA:  Left flank pain EXAM: CT ABDOMEN AND PELVIS WITHOUT CONTRAST TECHNIQUE: Multidetector CT imaging of the abdomen and pelvis was performed following the standard protocol without oral IV contrast. COMPARISON:  January 13, 2017 FINDINGS: Lower chest: Lung bases are clear. Hepatobiliary: No focal liver lesions are evident on this noncontrast  enhanced study. Gallbladder wall is not appreciably thickened. There is no biliary duct dilatation. Pancreas: There is no pancreatic mass or inflammatory focus. Spleen: No splenic lesions are evident. Adrenals/Urinary Tract: Adrenals bilaterally appear normal. Kidneys bilaterally show no evident mass or hydronephrosis on either side. There are several scattered 1 mm calculi in the right kidney. No calculi are evident in the left kidney. There is no appreciable ureteral calculus on either side. Urinary bladder is midline with wall thickness within normal limits. Stomach/Bowel: There is moderate stool throughout the colon. There is no appreciable bowel wall or mesenteric thickening. There is no demonstrable bowel obstruction. Terminal ileum appears unremarkable. There is no evident free air or portal venous air. Vascular/Lymphatic: There is no abdominal aortic aneurysm. No vascular lesions are evident on this noncontrast enhanced study. There is no adenopathy in the abdomen or pelvis. Reproductive: Prostate and seminal vesicles are normal in size and contour. There is no evident pelvic mass. Other: Appendix appears unremarkable. There is no evident abscess or ascites in the abdomen or pelvis. Musculoskeletal: There are no blastic or lytic bone lesions. There is no intramuscular or abdominal wall lesion. IMPRESSION: 1. Several 1 mm calculi in the right kidney, nonobstructing. No hydronephrosis or ureteral calculus on either side. The urinary bladder wall thickness is within normal limits. 2. No evident bowel obstruction. No abscess in the abdomen or pelvis. Appendix appears unremarkable. Electronically Signed   By: Bretta BangWilliam  Woodruff III M.D.   On: 04/08/2019 10:27    Procedures Procedures (including critical care time)  Medications Ordered in ED Medications  ketorolac (TORADOL) 30 MG/ML injection 30 mg (30 mg Intramuscular Given 04/08/19 1110)     Initial Impression / Assessment and Plan / ED Course  I  have reviewed the triage vital signs and the nursing notes.  Pertinent labs & imaging results that were available during my care of the patient were reviewed by me and considered in my medical decision making (see chart for details).       28 year old presents for left-sided flank pain.  He notes this feels similar to previous kidney stones, he does not have a stone on his CT on the left side, question if he recently passed one given the similar presentation.  Patient could also be having muscular skeletal pain given the spasm type intensity.  Patient will be treated as an outpatient with pain medicine muscle relaxers since return precautions.  Have low suspicion for any other acute enter thoracic or abdominal pathology.  He will return if he develops any new or worsening signs or symptoms.  He verbalized understanding and agreement to today's plan had no further questions or concerns.  Final Clinical  Impressions(s) / ED Diagnoses   Final diagnoses:  Flank pain  Nephrolithiasis    ED Discharge Orders         Ordered    oxyCODONE-acetaminophen (PERCOCET/ROXICET) 5-325 MG tablet  Every 6 hours PRN     04/08/19 1243    cyclobenzaprine (FLEXERIL) 10 MG tablet  2 times daily PRN     04/08/19 1243           Okey Regal, PA-C 04/08/19 1256    Carmin Muskrat, MD 04/08/19 1554

## 2019-04-08 NOTE — Discharge Instructions (Signed)
Please read attached information. If you experience any new or worsening signs or symptoms please return to the emergency room for evaluation. Please follow-up with your primary care provider or specialist as discussed. Please use medication prescribed only as directed and discontinue taking if you have any concerning signs or symptoms.   °

## 2019-11-04 ENCOUNTER — Other Ambulatory Visit: Payer: Self-pay

## 2019-11-05 ENCOUNTER — Encounter: Payer: Self-pay | Admitting: Family Medicine

## 2019-11-05 ENCOUNTER — Ambulatory Visit (INDEPENDENT_AMBULATORY_CARE_PROVIDER_SITE_OTHER): Payer: BC Managed Care – PPO | Admitting: Family Medicine

## 2019-11-05 VITALS — BP 130/82 | HR 64 | Temp 98.3°F | Ht 68.0 in | Wt 181.3 lb

## 2019-11-05 DIAGNOSIS — Z0000 Encounter for general adult medical examination without abnormal findings: Secondary | ICD-10-CM

## 2019-11-05 NOTE — Progress Notes (Signed)
Subjective:     Patient ID: AKIO HUDNALL, male   DOB: 1990-07-28, 29 y.o.   MRN: [id number]  HPI Javad is seen to establish care.  Generally fairly healthy.  He initially listed that he has had history of "ulcers ".  Not formally diagnosed with ulcers but has had some intermittent GERD symptoms in the past which were treated with antacids with resolution.  Has not had any major issues in about 3 years.  Takes no regular medications.  He has had previous tonsillectomy.  He had left knee meniscus surgery 2010 and right shoulder labrum repair 2011  He is married and has 18 children 84-year-old son and a 13 and half-year-old daughter.  He attended [employer].  Just recently moved back to the [place] area and works in Scientist, research (life sciences) estate.  Non-smoker.  Occasional alcohol use.  Family history reviewed.  Mother has history of depression.  His father's had hyperlipidemia and high blood pressure.  He had paternal grandfather that had MI at age 31.  Adith ran a half marathon last fall and is gearing up to try his first marathon this year.  His wife had some concerns because of family history of CAD in grandfather.  Linton Rump never had a chest pain running.  No history of exercise associated syncope or dizziness.  He has never had lipids checked to his knowledge.  No past medical history on file. No past surgical history on file.  reports that he has never smoked. He has never used smokeless tobacco. He reports current alcohol use. He reports that he does not use drugs. family history is not on file. Allergies  Allergen Reactions  . Amoxicillin Other (See Comments)    Did it involve swelling of the face/tongue/throat, SOB, or low BP? Unknown Did it involve sudden or severe rash/hives, skin peeling, or any reaction on the inside of your mouth or nose? Unknown Did you need to seek medical attention at a hospital or doctor's office? Unknown When did it last happen?Pt was an infant If all above answers are  "NO", may proceed with cephalosporin use.       Review of Systems  Constitutional: Negative for activity change, appetite change, fatigue and fever.  HENT: Negative for congestion, ear pain and trouble swallowing.   Eyes: Negative for pain and visual disturbance.  Respiratory: Negative for cough, shortness of breath and wheezing.   Cardiovascular: Negative for chest pain and palpitations.  Gastrointestinal: Negative for abdominal distention, abdominal pain, blood in stool, constipation, diarrhea, nausea, rectal pain and vomiting.  Genitourinary: Negative for dysuria, hematuria and testicular pain.  Musculoskeletal: Negative for arthralgias and joint swelling.  Skin: Negative for rash.  Neurological: Negative for dizziness, syncope and headaches.  Hematological: Negative for adenopathy.  Psychiatric/Behavioral: Negative for confusion and dysphoric mood.       Objective:   Physical Exam Vitals reviewed.  Constitutional:      Appearance: Normal appearance.  HENT:     Right Ear: Tympanic membrane normal.     Left Ear: Tympanic membrane normal.  Cardiovascular:     Rate and Rhythm: Normal rate and regular rhythm.     Heart sounds: No murmur.  Pulmonary:     Effort: Pulmonary effort is normal.     Breath sounds: Normal breath sounds. No wheezing or rales.  Abdominal:     Palpations: Abdomen is soft. There is no mass.     Tenderness: There is no abdominal tenderness.     Hernia: No hernia is  present.  Musculoskeletal:     Cervical back: Neck supple.     Right lower leg: No edema.     Left lower leg: No edema.  Lymphadenopathy:     Cervical: No cervical adenopathy.  Skin:    Findings: No rash.  Neurological:     General: No focal deficit present.     Mental Status: He is alert.     Cranial Nerves: No cranial nerve deficit.  Psychiatric:        Mood and Affect: Mood normal.        Thought Content: Thought content normal.        Assessment:     Physical exam.   Generally healthy 29 year old male.  Positive family history of hyperlipidemia, hypertension in father and CAD in paternal grandfather.  Patient has concerns because of family history as above though he has had no issues with exercise intolerance and overall appears to be very low risk.    Plan:     -We discussed getting some baseline screening labs -Continue low saturated fat diet and regular exercise habits -Confirm date of last tetanus  Kristian Covey MD [facility]

## 2019-11-12 ENCOUNTER — Other Ambulatory Visit: Payer: Self-pay

## 2019-11-12 ENCOUNTER — Other Ambulatory Visit (INDEPENDENT_AMBULATORY_CARE_PROVIDER_SITE_OTHER): Payer: BC Managed Care – PPO

## 2019-11-12 DIAGNOSIS — Z0000 Encounter for general adult medical examination without abnormal findings: Secondary | ICD-10-CM

## 2019-11-12 DIAGNOSIS — E038 Other specified hypothyroidism: Secondary | ICD-10-CM

## 2019-11-12 LAB — LIPID PANEL
Cholesterol: 170 mg/dL (ref 0–200)
HDL: 59.2 mg/dL (ref >39.00)
LDL Cholesterol: 96 mg/dL (ref 0–99)
NonHDL: 110.87
Total CHOL/HDL Ratio: 3
Triglycerides: 75 mg/dL (ref 0.0–149.0)
VLDL: 15 mg/dL (ref 0.0–40.0)

## 2019-11-12 LAB — BASIC METABOLIC PANEL
BUN: 18 mg/dL (ref 6–23)
CO2: 26 mEq/L (ref 19–32)
Calcium: 9.7 mg/dL (ref 8.4–10.5)
Chloride: 101 mEq/L (ref 96–112)
Creatinine, Ser: 1.24 mg/dL (ref 0.40–1.50)
GFR: 67.84 mL/min (ref >60.00)
Glucose, Bld: 76 mg/dL (ref 70–99)
Potassium: 4.4 mEq/L (ref 3.5–5.1)
Sodium: 136 mEq/L (ref 135–145)

## 2019-11-12 LAB — HEPATIC FUNCTION PANEL
ALT: 16 U/L (ref 0–53)
AST: 35 U/L (ref 0–37)
Albumin: 4.8 g/dL (ref 3.5–5.2)
Alkaline Phosphatase: 72 U/L (ref 39–117)
Bilirubin, Direct: 0.1 mg/dL (ref 0.0–0.3)
Total Bilirubin: 0.7 mg/dL (ref 0.2–1.2)
Total Protein: 6.7 g/dL (ref 6.0–8.3)

## 2019-11-12 LAB — CBC WITH DIFFERENTIAL/PLATELET
Basophils Absolute: 0 10*3/uL (ref 0.0–0.1)
Basophils Relative: 0.7 % (ref 0.0–3.0)
Eosinophils Absolute: 0.1 10*3/uL (ref 0.0–0.7)
Eosinophils Relative: 2.5 % (ref 0.0–5.0)
HCT: 39.6 % (ref 39.0–52.0)
Hemoglobin: 13.8 g/dL (ref 13.0–17.0)
Lymphocytes Relative: 25.4 % (ref 12.0–46.0)
Lymphs Abs: 1.3 10*3/uL (ref 0.7–4.0)
MCHC: 34.9 g/dL (ref 30.0–36.0)
MCV: 85.2 fl (ref 78.0–100.0)
Monocytes Absolute: 0.4 10*3/uL (ref 0.1–1.0)
Monocytes Relative: 7.4 % (ref 3.0–12.0)
Neutro Abs: 3.2 10*3/uL (ref 1.4–7.7)
Neutrophils Relative %: 64 % (ref 43.0–77.0)
Platelets: 219 10*3/uL (ref 150.0–400.0)
RBC: 4.65 Mil/uL (ref 4.22–5.81)
RDW: 13.2 % (ref 11.5–15.5)
WBC: 5 10*3/uL (ref 4.0–10.5)

## 2019-11-12 LAB — TSH: TSH: 6.31 u[IU]/mL — ABNORMAL HIGH (ref 0.35–4.50)

## 2020-01-06 NOTE — Addendum Note (Signed)
Addended by: Lerry Liner on: 01/06/2020 04:26 PM   Modules accepted: Orders

## 2020-02-04 ENCOUNTER — Other Ambulatory Visit: Payer: BC Managed Care – PPO

## 2020-02-04 ENCOUNTER — Other Ambulatory Visit: Payer: Self-pay

## 2020-02-04 DIAGNOSIS — E039 Hypothyroidism, unspecified: Secondary | ICD-10-CM

## 2020-02-04 DIAGNOSIS — E038 Other specified hypothyroidism: Secondary | ICD-10-CM

## 2020-02-05 LAB — TSH: TSH: 3.87 mIU/L (ref 0.40–4.50)

## 2020-02-05 LAB — T4, FREE: Free T4: 1.2 ng/dL (ref 0.8–1.8)

## 2020-03-14 IMAGING — CT CT RENAL STONE PROTOCOL
2 of 4 series · 16 of 46 positions shown, 18 images · non-contrast
Comparison: January 13, 2017

CLINICAL DATA: Left flank pain

EXAM:
CT ABDOMEN AND PELVIS WITHOUT CONTRAST
TECHNIQUE: Multidetector CT imaging of the abdomen and pelvis was performed
following the standard protocol without oral IV contrast.

[Series 3: stone study 5.0 i30f 2 · axial · 0.66mm/px · z∈[+536,+971]mm · 13 of 95 slices shown, 15 images]
[im 4/95  soft-tissue]
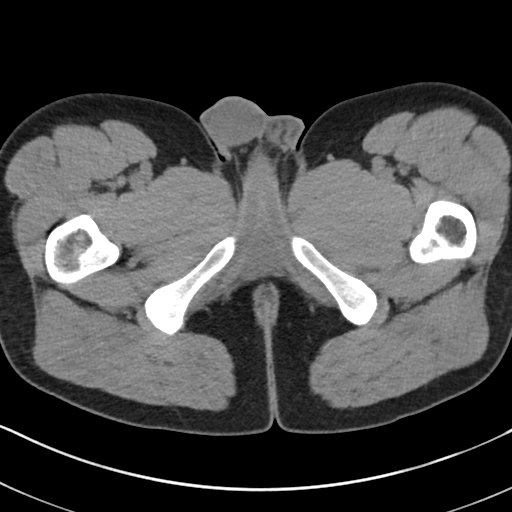
[im 4/95  bone]
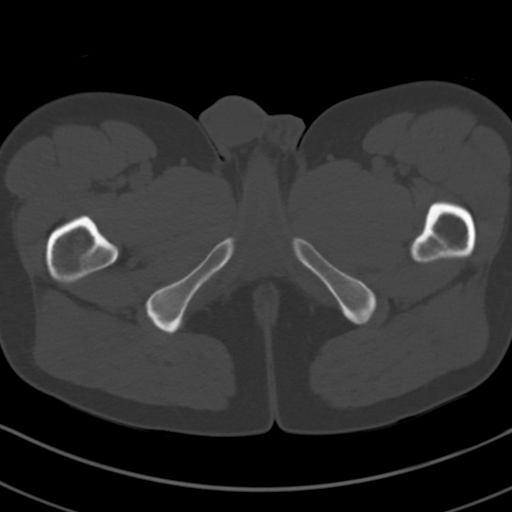
[im 12/95  soft-tissue]
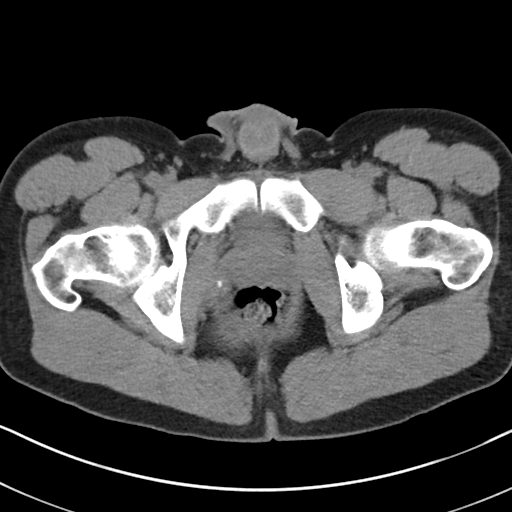
[im 20/95  soft-tissue]
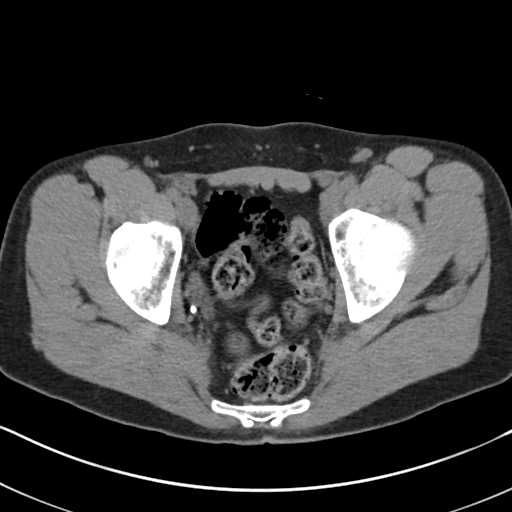
[im 28/95  soft-tissue]
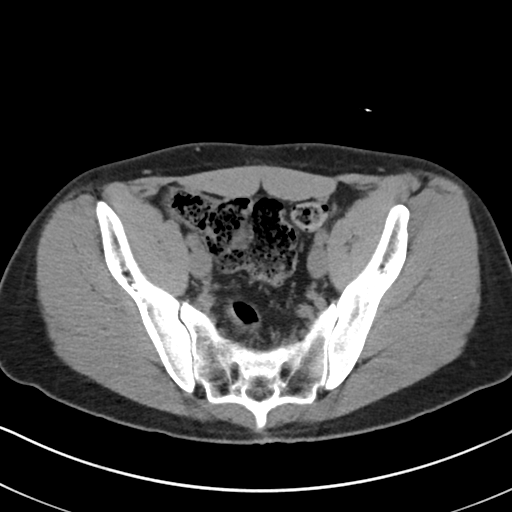
[im 32/95  soft-tissue]
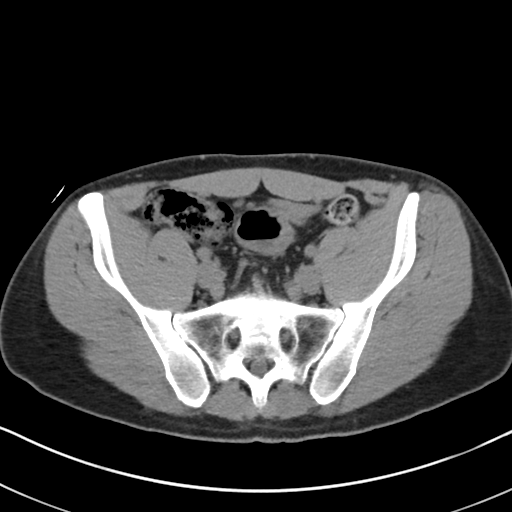
[im 40/95  soft-tissue]
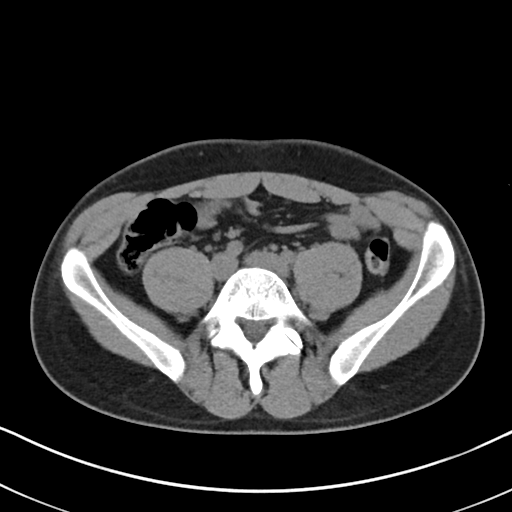
[im 48/95  soft-tissue]
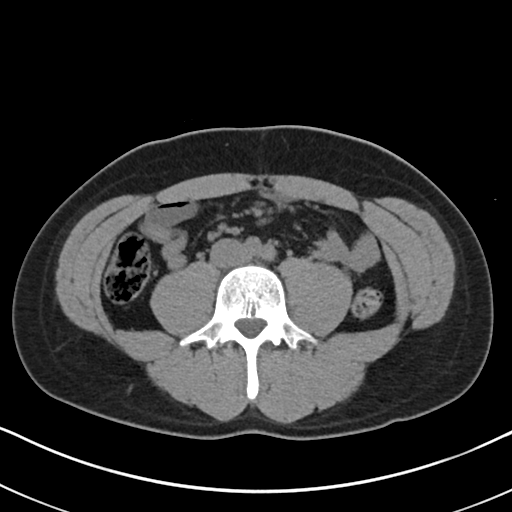
[im 55/95  soft-tissue]
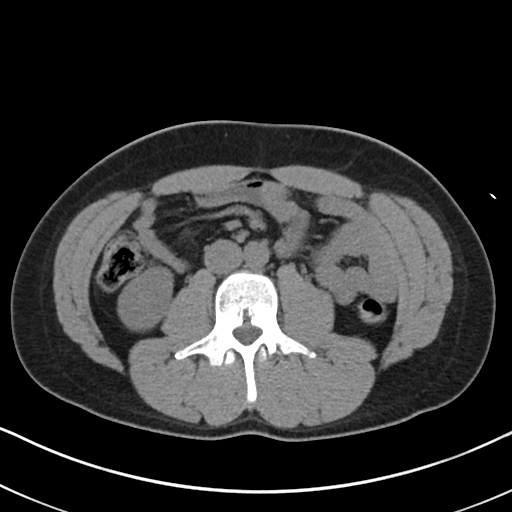
[im 63/95  soft-tissue]
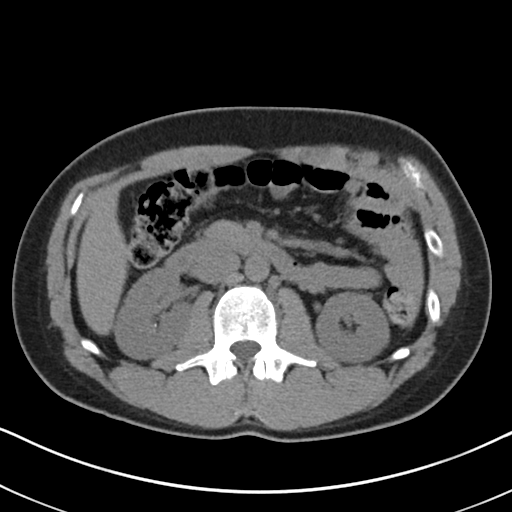
[im 63/95  bone]
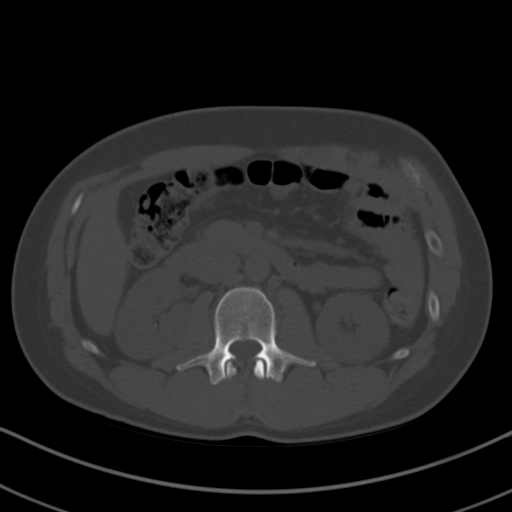
[im 67/95  soft-tissue]
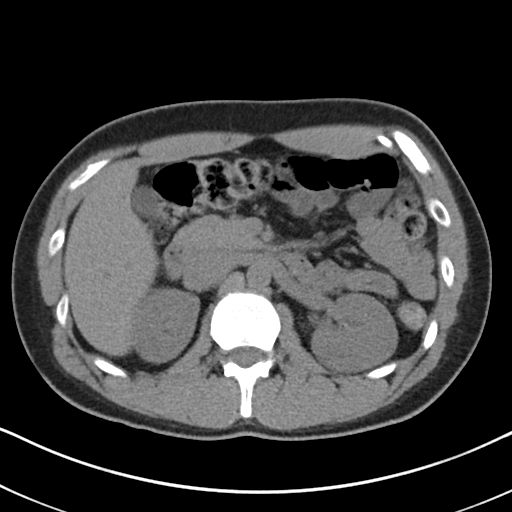
[im 75/95  soft-tissue]
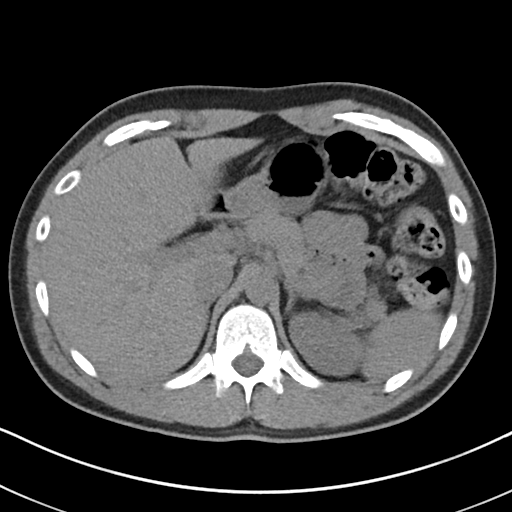
[im 83/95  soft-tissue]
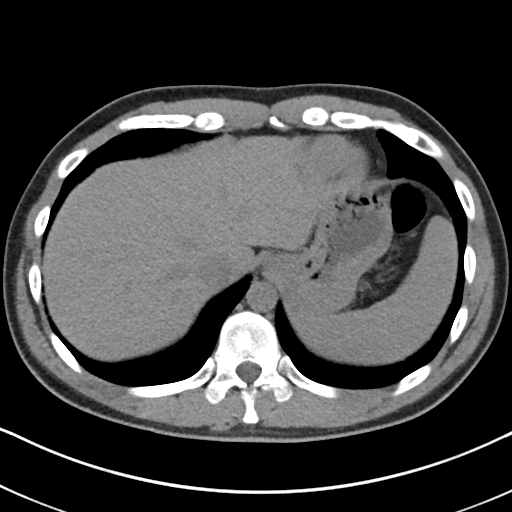
[im 91/95  soft-tissue]
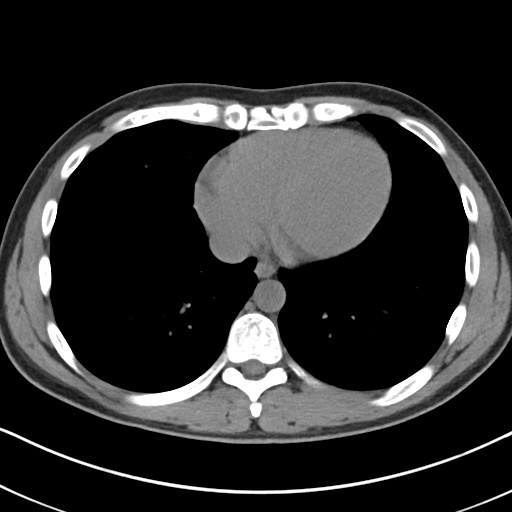

[Series 6: coronal soft tissue · coronal · 0.72mm/px · 3 of 90 slices shown]
[im 30/90  soft-tissue]
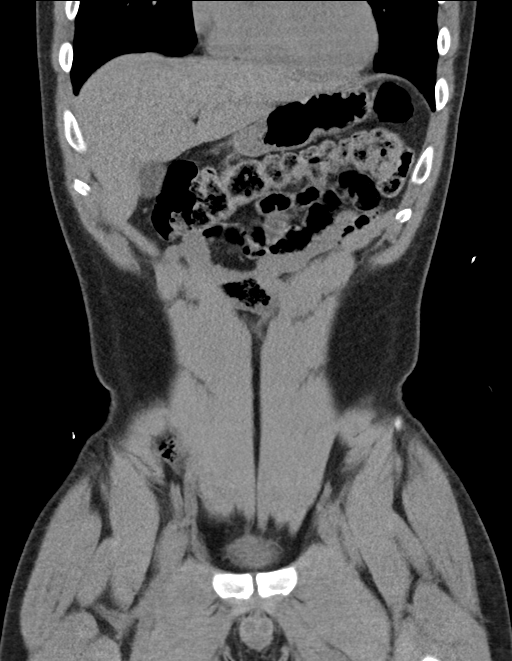
[im 40/90  soft-tissue]
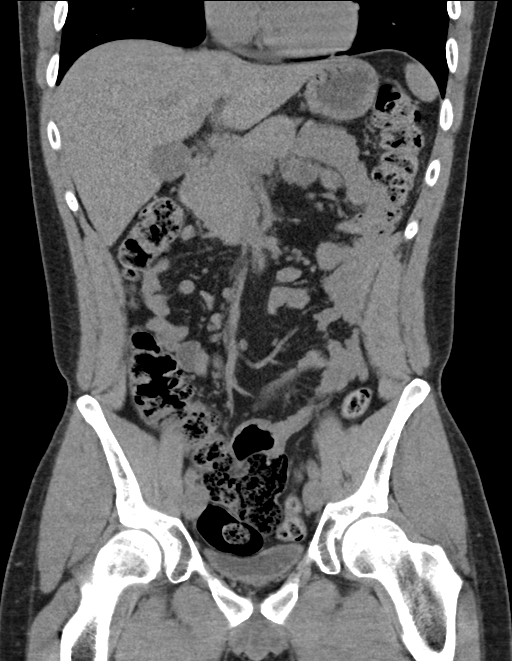
[im 50/90  soft-tissue]
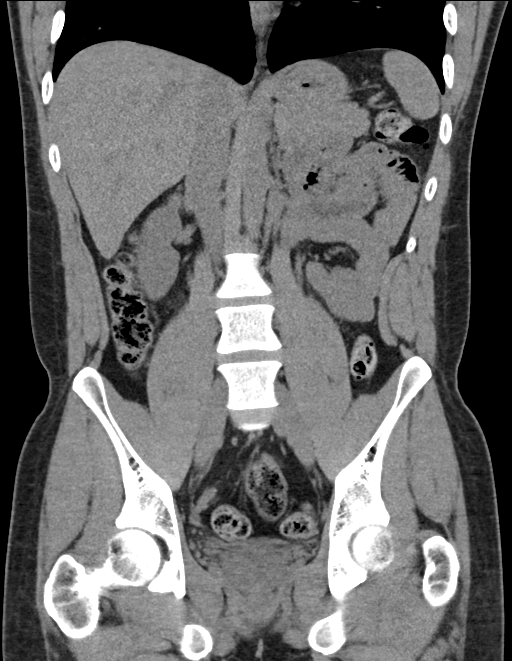

[16 of 46 positions shown; findings below may reference images not displayed]

FINDINGS: Lower chest: Lung bases are clear.

Hepatobiliary: No focal liver lesions are evident on this
noncontrast enhanced study. Gallbladder wall is not appreciably
thickened. There is no biliary duct dilatation.

Pancreas: There is no pancreatic mass or inflammatory focus.

Spleen: No splenic lesions are evident.

Adrenals/Urinary Tract: Adrenals bilaterally appear normal. Kidneys
bilaterally show no evident mass or hydronephrosis on either side.
There are several scattered 1 mm calculi in the right kidney. No
calculi are evident in the left kidney. There is no appreciable
ureteral calculus on either side. Urinary bladder is midline with
wall thickness within normal limits.

Stomach/Bowel: There is moderate stool throughout the colon. There
is no appreciable bowel wall or mesenteric thickening. There is no
demonstrable bowel obstruction. Terminal ileum appears unremarkable.
There is no evident free air or portal venous air.

Vascular/Lymphatic: There is no abdominal aortic aneurysm. No
vascular lesions are evident on this noncontrast enhanced study.
There is no adenopathy in the abdomen or pelvis.

Reproductive: Prostate and seminal vesicles are normal in size and
contour. There is no evident pelvic mass.

Other: Appendix appears unremarkable. There is no evident abscess or
ascites in the abdomen or pelvis.

Musculoskeletal: There are no blastic or lytic bone lesions. There
is no intramuscular or abdominal wall lesion.
IMPRESSION: 1. Several 1 mm calculi in the right kidney, nonobstructing. No
hydronephrosis or ureteral calculus on either side. The urinary
bladder wall thickness is within normal limits.

2. No evident bowel obstruction. No abscess in the abdomen or
pelvis. Appendix appears unremarkable.

## 2023-05-01 ENCOUNTER — Ambulatory Visit: Payer: Self-pay | Admitting: Family Medicine
